# Patient Record
Sex: Female | Born: 1980 | Race: White | Hispanic: Yes | Marital: Married | Smoking: Never smoker
Health system: Southern US, Community
[De-identification: ages and names within clinical notes are randomized; demographics above are authoritative.]

## PROBLEM LIST (undated history)

## (undated) DIAGNOSIS — R87629 Unspecified abnormal cytological findings in specimens from vagina: Secondary | ICD-10-CM

## (undated) HISTORY — PX: BREAST SURGERY: SHX581

## (undated) HISTORY — DX: Unspecified abnormal cytological findings in specimens from vagina: R87.629

## (undated) HISTORY — PX: OTHER SURGICAL HISTORY: SHX169

---

## 2012-11-10 HISTORY — PX: BREAST EXCISIONAL BIOPSY: SUR124

## 2019-11-28 ENCOUNTER — Encounter: Payer: Self-pay | Admitting: Obstetrics & Gynecology

## 2020-01-04 ENCOUNTER — Other Ambulatory Visit: Payer: Self-pay

## 2020-01-04 ENCOUNTER — Ambulatory Visit (INDEPENDENT_AMBULATORY_CARE_PROVIDER_SITE_OTHER): Payer: Self-pay | Admitting: Obstetrics & Gynecology

## 2020-01-04 ENCOUNTER — Encounter: Payer: Self-pay | Admitting: Obstetrics & Gynecology

## 2020-01-04 VITALS — BP 132/82 | HR 71 | Ht 62.0 in

## 2020-01-04 DIAGNOSIS — Z01419 Encounter for gynecological examination (general) (routine) without abnormal findings: Secondary | ICD-10-CM

## 2020-01-04 DIAGNOSIS — Z1239 Encounter for other screening for malignant neoplasm of breast: Secondary | ICD-10-CM

## 2020-01-04 DIAGNOSIS — Z8742 Personal history of other diseases of the female genital tract: Secondary | ICD-10-CM

## 2020-01-04 DIAGNOSIS — Z3009 Encounter for other general counseling and advice on contraception: Secondary | ICD-10-CM

## 2020-01-04 NOTE — Progress Notes (Signed)
Subjective:     Brenda Cole is a 39 y.o. female here for a routine exam. G2P2 Current complaints: Pts last PAP showed abnormal cells. That was 7 years ago and she has a cryo. Pt denies other issues. She is from [place] in [place]. [place]        Gynecologic History No LMP recorded. Contraception: OCP (estrogen/progesterone) Last Pap: 7 years prev. Results were: history of abnormal PAP s/p cryo Last mammogram: n/a.   Obstetric History The following portions of the patient's history were reviewed and updated as appropriate: allergies, current medications, past family history, past medical history, past social history, past surgical history and problem list.  Review of Systems Pertinent items are noted in HPI.    Objective:  BP 132/82   Pulse 71   Ht 5\' 2"  (1.575 m)  General Appearance:    Alert, cooperative, no distress, appears stated age  Head:    Normocephalic, without obvious abnormality, atraumatic  Eyes:    conjunctiva/corneas clear, EOM's intact, both eyes  Ears:    Normal external ear canals, both ears  Nose:   Nares normal, septum midline, mucosa normal, no drainage    or sinus tenderness  Throat:   Lips, mucosa, and tongue normal; teeth and gums normal  Neck:   Supple, symmetrical, trachea midline, no adenopathy;    thyroid:  no enlargement/tenderness/nodules  Back:     Symmetric, no curvature, ROM normal, no CVA tenderness  Lungs:     respirations unlabored  Chest Wall:    No tenderness or deformity   Heart:    Regular rate and rhythm  Breast Exam:    No tenderness, masses, or nipple abnormality  Abdomen:     Soft, non-tender, bowel sounds active all four quadrants,    no masses, no organomegaly  Genitalia:    Normal female without lesion, discharge or tenderness     Extremities:   Extremities normal, atraumatic, no cyanosis or edema  Pulses:   2+ and symmetric all extremities  Skin:   Skin color, texture, turgor normal, no  rashes or lesions    Assessment:    Healthy female exam.   history of abnormal PAP s/p cryo Breast cancer screen  Contraception counseling- pt would like a BTL or some permanent form of contraception. She was going to [facility] but, their grant ran out. She is on OCPs but, does not want to cont this. She has a [facility] and would like to also consider another IUD      Plan:    PAP NOT done. Pt referred to the [facility] program for PAP. She requested a full exam which was WNL Breast cancer screen- pt needs mammogram. She turns 39 next week. Referred for the breast cancer grant.  Contraception: pt referred the [facility] for a LnIUD.  Pt will cont OCPs until she gets to the HD Pt told that she can call back at any point if she has any issues with getting these services.   Loye Reininger L. Harraway-Smith, M.D., [facility]

## 2020-01-04 NOTE — Progress Notes (Signed)
Patient name sent to [facility] program and next free pap clinic. Armandina Stammer RN

## 2020-01-06 ENCOUNTER — Telehealth: Payer: Self-pay

## 2020-01-06 NOTE — Telephone Encounter (Signed)
Telephoned patient at the home number. Call could not be completed at this time, unable to leave a message from [facility].

## 2020-01-11 ENCOUNTER — Telehealth: Payer: Self-pay

## 2020-01-11 NOTE — Telephone Encounter (Signed)
Patient returned call to the office and given the [facility] scheduling number [phone]. Armandina Stammer RN

## 2020-01-13 ENCOUNTER — Other Ambulatory Visit: Payer: Self-pay

## 2020-01-13 DIAGNOSIS — Z1231 Encounter for screening mammogram for malignant neoplasm of breast: Secondary | ICD-10-CM

## 2020-02-09 ENCOUNTER — Other Ambulatory Visit: Payer: Self-pay | Admitting: Obstetrics and Gynecology

## 2020-02-09 ENCOUNTER — Other Ambulatory Visit: Payer: Self-pay

## 2020-02-09 ENCOUNTER — Ambulatory Visit: Payer: Self-pay | Admitting: Women's Health

## 2020-02-09 ENCOUNTER — Ambulatory Visit: Payer: Self-pay

## 2020-02-09 ENCOUNTER — Ambulatory Visit
Admission: RE | Admit: 2020-02-09 | Discharge: 2020-02-09 | Disposition: A | Discharge status: Home / Self Care | Payer: No Typology Code available for payment source | Source: Ambulatory Visit | Attending: Obstetrics and Gynecology | Admitting: Obstetrics and Gynecology

## 2020-02-09 ENCOUNTER — Encounter: Payer: Self-pay | Admitting: Women's Health

## 2020-02-09 VITALS — BP 136/84 | Temp 98.2°F | Wt 157.0 lb

## 2020-02-09 DIAGNOSIS — N6310 Unspecified lump in the right breast, unspecified quadrant: Secondary | ICD-10-CM

## 2020-02-09 DIAGNOSIS — Z124 Encounter for screening for malignant neoplasm of cervix: Secondary | ICD-10-CM

## 2020-02-09 DIAGNOSIS — Z1239 Encounter for other screening for malignant neoplasm of breast: Secondary | ICD-10-CM

## 2020-02-09 NOTE — Patient Instructions (Signed)
Preventing Cervical Cancer Cervical cancer is cancer that grows on the cervix. The cervix is at the bottom of the uterus. It connects the uterus to the vagina. The uterus is where a baby develops during pregnancy. Cancer occurs when cells become abnormal and start to grow out of control. If cervical cancer is not found early, it can spread and become dangerous. Cervical cancer cannot always be prevented, but you can take steps to lower your risk of developing this condition. How can this condition affect me? Cervical cancer grows slowly and may not cause any symptoms at first. Over time, the cancer can grow deep into the cervix tissue and spread to other areas. This may take years, and it may happen without you knowing about it. If it is found early, cervical cancer can be treated effectively. If the cancer has grown deep into your cervix or has spread, it will be more difficult to treat. Most cases of cervical cancer are caused by an STI (sexually transmitted infection) called human papillomavirus (HPV). One way to reduce your risk of cervical cancer is to take steps to avoid infection with the HPV virus. Getting regular Pap tests is also important because this can help identify changes in cells that could lead to cancer. Your chances of getting this disease can also be reduced by making certain lifestyle changes. What can increase my risk? You are more likely to develop this condition if:  You have certain things in your sexual history, such as: ? Having a sexually transmitted viral infection. These include chlamydia and herpes. ? Having more than one sexual partner, or having sex with someone who has more than one sexual partner. ? Not using condoms during sex. ? Having been sexually active before the age of 18.  Your mother took a medicine called diethylstilbestrol (DES) while pregnant with you, causing you to be exposed to this medicine before birth.  Your mother or sister has had cervical  cancer.  You are between the ages of 40-50.  You have or have had certain other medical conditions, such as: ? Previous cancer of the vagina or vulva. ? A weakened body defense system (immune system). ? A history of dysplasia of the cervix.  You use oral contraceptives, also called birth control pills.  You smoke or breathe in secondhand smoke. What actions can I take to prevent cervical cancer? Preventing HPV infection   Ask your health care provider about getting the HPV vaccine. If you are 26 years old or younger, you may need to get this vaccine, which is given in three doses over 6 months. This vaccine protects against the types of HPV that could cause cancer.  Limit the number of people you have sex with. Also avoid having sex with people who have had many sex partners.  Use a latex condom every time you have sex. Getting Pap tests Get Pap tests regularly, starting at age 21. Talk with your health care provider about how often you need these tests. Having regular Pap tests will help identify changes in cells that could lead to cancer. Steps can then be taken to prevent cancer from developing.  Most women who are 21?40 years of age should have a Pap test every 3 years.  Most women who are 30?40 years of age should have a Pap test in combination with an HPV test every 5 years.  Women with a higher risk of cervical cancer, such as those with a weakened immune system or those who were   exposed to DES medicine before birth, may need more frequent testing. Making other lifestyle changes   Do not use any products that contain nicotine or tobacco, such as cigarettes, e-cigarettes, and chewing tobacco. If you need help quitting, ask your health care provider.  Eat a healthy diet that includes at least 5 servings of fruits and vegetables every day.  Lose weight if you are overweight. Where to find support Talk with your health care provider, school nurse, or local health department  for guidance about screening and vaccination. Some children and teens may be able to get the HPV vaccine free of charge through the U.S. government's Vaccines for Children (VFC) program. Other places that provide vaccinations include:  Public health clinics. Check with your local health department.  Federally Qualified Health Centers, where you would pay only what you can afford. To find one near you, check this website: www.fqhc.org/find-an-fqhc/  Rural Health Clinics. These are part of a program for Medicare and Medicaid patients who live in rural areas. The National Breast and Cervical Cancer Early Detection Program also provides breast and cervical cancer screenings and diagnostic services to low-income, uninsured, and underinsured women. Cervical cancer can be passed down through families. Talk with your health care provider or a genetic counselor to learn more about genetic testing for cancer. Where to find more information Learn more about cervical cancer from:  American College of Gynecology: www.acog.org  American Cancer Society: www.cancer.org  Centers for Disease Control and Prevention: www.cdc.gov Contact a health care provider if you have:  Pelvic pain.  Unusual discharge or bleeding from your vagina. Summary  Cervical cancer is cancer that grows on the cervix. The cervix is at the bottom of the uterus.  Ask your health care provider about getting the HPV vaccine.  Be sure to get regular Pap tests as recommended by your health care provider.  See your health care provider right away if you have any pelvic pain or unusual discharge or bleeding from your vagina. This information is not intended to replace advice given to you by your health care provider. Make sure you discuss any questions you have with your health care provider. Document Revised: 05/30/2019 Document Reviewed: 05/30/2019 Elsevier Patient Education  2020 Elsevier Inc. Breast Self-Awareness Breast  self-awareness means being familiar with how your breasts look and feel. It involves checking your breasts regularly and reporting any changes to your health care provider. Practicing breast self-awareness is important. Sometimes changes may not be harmful (are benign), but sometimes a change in your breasts can be a sign of a serious medical problem. It is important to learn how to do this procedure correctly so that you can catch problems early, when treatment is more likely to be successful. All women should practice breast self-awareness, including women who have had breast implants. What you need:  A mirror.  A well-lit room. How to do a breast self-exam A breast self-exam is one way to learn what is normal for your breasts and whether your breasts are changing. To do a breast self-exam: Look for changes  1. Remove all the clothing above your waist. 2. Stand in front of a mirror in a room with good lighting. 3. Put your hands on your hips. 4. Push your hands firmly downward. 5. Compare your breasts in the mirror. Look for differences between them (asymmetry), such as: ? Differences in shape. ? Differences in size. ? Puckers, dips, and bumps in one breast and not the other. 6. Look at each   breast for changes in the skin, such as: ? Redness. ? Scaly areas. 7. Look for changes in your nipples, such as: ? Discharge. ? Bleeding. ? Dimpling. ? Redness. ? A change in position. Feel for changes Carefully feel your breasts for lumps and changes. It is best to do this while lying on your back on the floor, and again while sitting or standing in the tub or shower with soapy water on your skin. Feel each breast in the following way: 1. Place the arm on the side of the breast you are examining above your head. 2. Feel your breast with the other hand. 3. Start in the nipple area and make -inch (2 cm) overlapping circles to feel your breast. Use the pads of your three middle fingers to do  this. Apply light pressure, then medium pressure, then firm pressure. The light pressure will allow you to feel the tissue closest to the skin. The medium pressure will allow you to feel the tissue that is a little deeper. The firm pressure will allow you to feel the tissue close to the ribs. 4. Continue the overlapping circles, moving downward over the breast until you feel your ribs below your breast. 5. Move one finger-width toward the center of the body. Continue to use the -inch (2 cm) overlapping circles to feel your breast as you move slowly up toward your collarbone. 6. Continue the up-and-down exam using all three pressures until you reach your armpit.  Write down what you find Writing down what you find can help you remember what to discuss with your health care provider. Write down:  What is normal for each breast.  Any changes that you find in each breast, including: ? The kind of changes you find. ? Any pain or tenderness. ? Size and location of any lumps.  Where you are in your menstrual cycle, if you are still menstruating. General tips and recommendations  Examine your breasts every month.  If you are breastfeeding, the best time to examine your breasts is after a feeding or after using a breast pump.  If you menstruate, the best time to examine your breasts is 5-7 days after your period. Breasts are generally lumpier during menstrual periods, and it may be more difficult to notice changes.  With time and practice, you will become more familiar with the variations in your breasts and more comfortable with the exam. Contact a health care provider if you:  See a change in the shape or size of your breasts or nipples.  See a change in the skin of your breast or nipples, such as a reddened or scaly area.  Have unusual discharge from your nipples.  Find a lump or thick area that was not there before.  Have pain in your breasts.  Have any concerns related to your  breast health. Summary  Breast self-awareness includes looking for physical changes in your breasts, as well as feeling for any changes within your breasts.  Breast self-awareness should be performed in front of a mirror in a well-lit room.  You should examine your breasts every month. If you menstruate, the best time to examine your breasts is 5-7 days after your menstrual period.  Let your health care provider know of any changes you notice in your breasts, including changes in size, changes on the skin, pain or tenderness, or unusual fluid from your nipples. This information is not intended to replace advice given to you by your health care provider. Make sure   you discuss any questions you have with your health care provider. Document Revised: 06/15/2018 Document Reviewed: 06/15/2018 Elsevier Patient Education  2020 Elsevier Inc.  

## 2020-02-09 NOTE — Progress Notes (Signed)
Brenda Cole is a 39 y.o. [phone] female who presents to [facility] clinic today with no complaints.    Pap Smear: Pap smear completed today. Last Pap smear was more than 3 years ago at unknown clinic and was normal. Per patient has history of an abnormal Pap smear. Last Pap smear result is not available in Epic.   Physical exam: Breasts Breasts symmetrical. No skin abnormalities bilateral breasts. No nipple retraction bilateral breasts. No nipple discharge bilateral breasts. No lymphadenopathy. No lumps palpated left breast. Small lump too deep and small to be measured palpated at about 12 o'clock position on right breast. Small, mobile, smooth, non-tender. Small area of red rash on upper, outer aspect of right breast that patient reports is itchy. Patient reports she recently started using a new soap and the rash appeared shortly after. Pt states she went back to using her original soap yesterday and her rash is resolving.   Pelvic/Bimanual Ext Genitalia No lesions, no swelling and no discharge observed on external genitalia.        Vagina Vagina pink and normal texture. No lesions or discharge observed in vagina.        Cervix Cervix is present. Cervix pink and of normal texture. No discharge observed.    Uterus Uterus is present and palpable. Uterus in normal position and normal size.        Adnexae Bilateral ovaries present and palpable. No tenderness on palpation.         Rectovaginal No rectal exam completed today since patient had no rectal complaints. No skin abnormalities observed on exam.     Smoking History: Patient has never smoked not referred to quit line.    Patient Navigation: Patient education provided. Access to services provided for patient through [facility] program. No interpreter provided. No transportation provided   Colorectal Cancer Screening: Per patient has never had colonoscopy completed No complaints today.    Breast and Cervical Cancer Risk  Assessment: Patient does not have family history of breast cancer, known genetic mutations, or radiation treatment to the chest before age 59. Patient does not have history of cervical dysplasia, immunocompromised, or DES exposure in-utero.  Risk Assessment    Risk Scores      02/09/2020   Last edited by: Narda Rutherford, LPN   5-year risk: 0.5 %   Lifetime risk: 7.8 %          A: B[facility]CP exam with pap smear Complaint of none.  P: [facility] for a diagnostic mammogram. Appointment scheduled 02/09/2020.  Marylen Ponto, NP 02/09/2020 12:52 PM

## 2020-02-10 LAB — CYTOLOGY - PAP
Comment: NEGATIVE
Diagnosis: NEGATIVE
High risk HPV: NEGATIVE

## 2020-02-15 ENCOUNTER — Telehealth: Payer: Self-pay

## 2020-02-15 NOTE — Telephone Encounter (Signed)
Normal Pap result letter mailed to patient.  

## 2020-02-22 ENCOUNTER — Other Ambulatory Visit: Payer: Self-pay

## 2020-02-22 ENCOUNTER — Ambulatory Visit
Admission: RE | Admit: 2020-02-22 | Discharge: 2020-02-22 | Disposition: A | Discharge status: Home / Self Care | Payer: No Typology Code available for payment source | Source: Ambulatory Visit | Attending: Obstetrics and Gynecology | Admitting: Obstetrics and Gynecology

## 2020-02-22 ENCOUNTER — Other Ambulatory Visit: Payer: Self-pay | Admitting: Obstetrics and Gynecology

## 2020-02-22 DIAGNOSIS — N6310 Unspecified lump in the right breast, unspecified quadrant: Secondary | ICD-10-CM

## 2020-02-28 ENCOUNTER — Other Ambulatory Visit: Payer: Self-pay

## 2020-02-28 ENCOUNTER — Other Ambulatory Visit: Payer: Self-pay | Admitting: Obstetrics and Gynecology

## 2020-02-28 ENCOUNTER — Ambulatory Visit
Admission: RE | Admit: 2020-02-28 | Discharge: 2020-02-28 | Disposition: A | Discharge status: Home / Self Care | Payer: No Typology Code available for payment source | Source: Ambulatory Visit | Attending: Obstetrics and Gynecology | Admitting: Obstetrics and Gynecology

## 2020-02-28 ENCOUNTER — Inpatient Hospital Stay: Admission: RE | Admit: 2020-02-28 | Payer: No Typology Code available for payment source | Source: Ambulatory Visit

## 2020-02-28 DIAGNOSIS — Z1231 Encounter for screening mammogram for malignant neoplasm of breast: Secondary | ICD-10-CM

## 2020-02-28 DIAGNOSIS — N6310 Unspecified lump in the right breast, unspecified quadrant: Secondary | ICD-10-CM

## 2020-03-23 ENCOUNTER — Telehealth: Payer: Self-pay

## 2020-03-23 NOTE — Telephone Encounter (Signed)
Patient informed negative Pap/HPV results, informed pap letter returned. Patient states her address is still the same, verbalized understanding of results.

## 2020-08-27 ENCOUNTER — Other Ambulatory Visit: Payer: Self-pay | Admitting: Obstetrics and Gynecology

## 2020-08-27 DIAGNOSIS — N63 Unspecified lump in unspecified breast: Secondary | ICD-10-CM

## 2020-08-29 ENCOUNTER — Ambulatory Visit
Admission: RE | Admit: 2020-08-29 | Discharge: 2020-08-29 | Disposition: A | Discharge status: Home / Self Care | Payer: No Typology Code available for payment source | Source: Ambulatory Visit | Attending: Obstetrics and Gynecology | Admitting: Obstetrics and Gynecology

## 2020-08-29 ENCOUNTER — Other Ambulatory Visit: Payer: Self-pay

## 2020-08-29 DIAGNOSIS — N63 Unspecified lump in unspecified breast: Secondary | ICD-10-CM

## 2021-01-31 ENCOUNTER — Other Ambulatory Visit: Payer: Self-pay

## 2021-01-31 DIAGNOSIS — Z853 Personal history of malignant neoplasm of breast: Secondary | ICD-10-CM

## 2021-01-31 DIAGNOSIS — Z08 Encounter for follow-up examination after completed treatment for malignant neoplasm: Secondary | ICD-10-CM

## 2021-02-26 ENCOUNTER — Ambulatory Visit: Payer: No Typology Code available for payment source

## 2021-02-26 ENCOUNTER — Other Ambulatory Visit: Payer: No Typology Code available for payment source

## 2021-02-26 IMAGING — MG DIGITAL DIAGNOSTIC BILAT W/ TOMO W/ CAD
8 of 14 series · 8 of 40 positions shown · non-contrast
Comparison: None.

CLINICAL DATA: 39-year-old female presenting with a palpable area
of concern felt by her doctor in the superior right breast. Patient
has a history of remote excisional right biopsy.

EXAM:
DIGITAL DIAGNOSTIC BILATERAL MAMMOGRAM WITH CAD AND TOMO

[R CC synth-2D (1 of 2)]
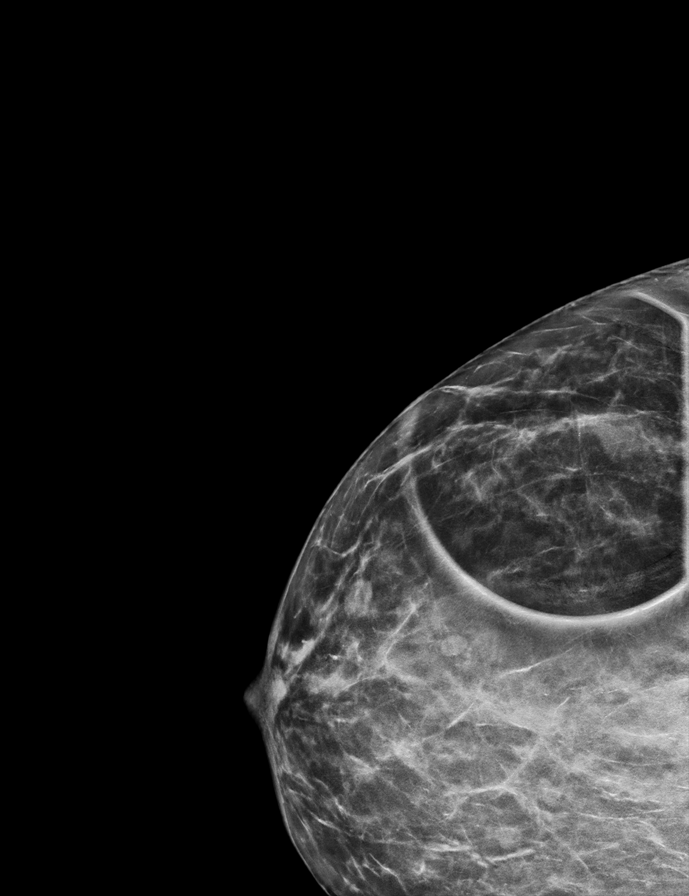

[R MLO synth-2D (1 of 3)]
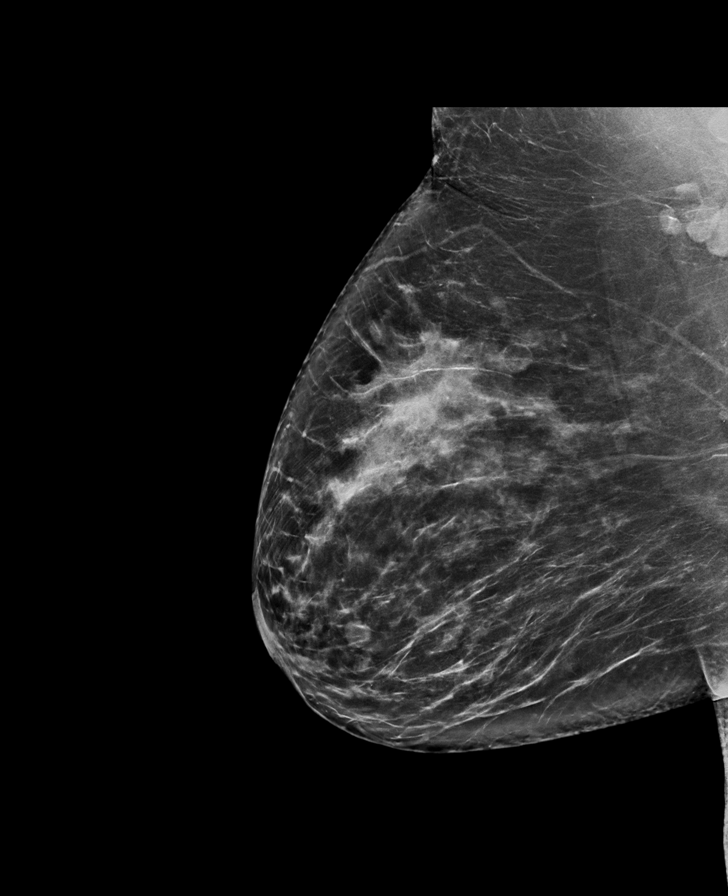

[R MLO synth-2D (2 of 3)]
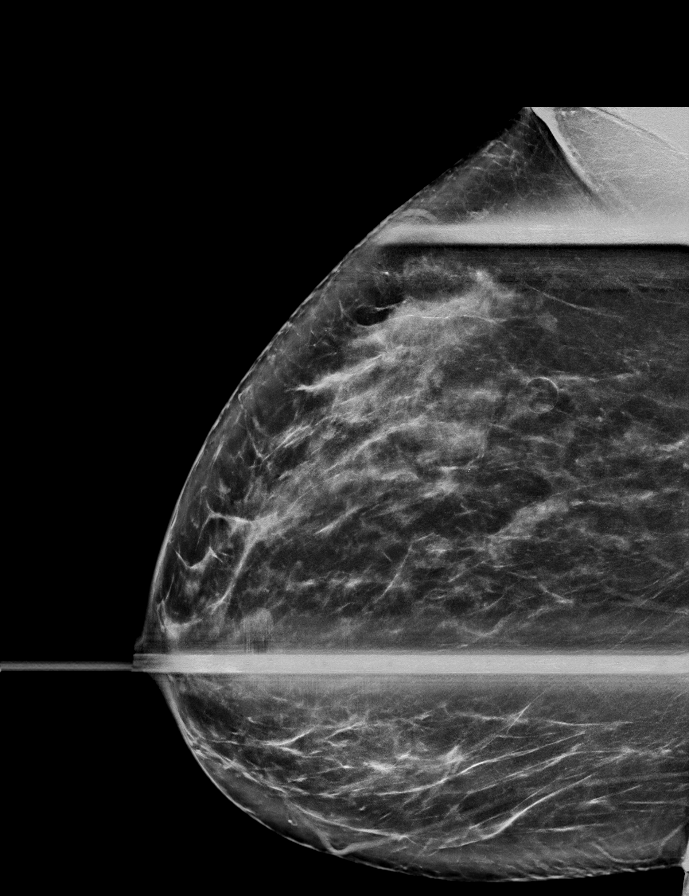

[L CC synth-2D]
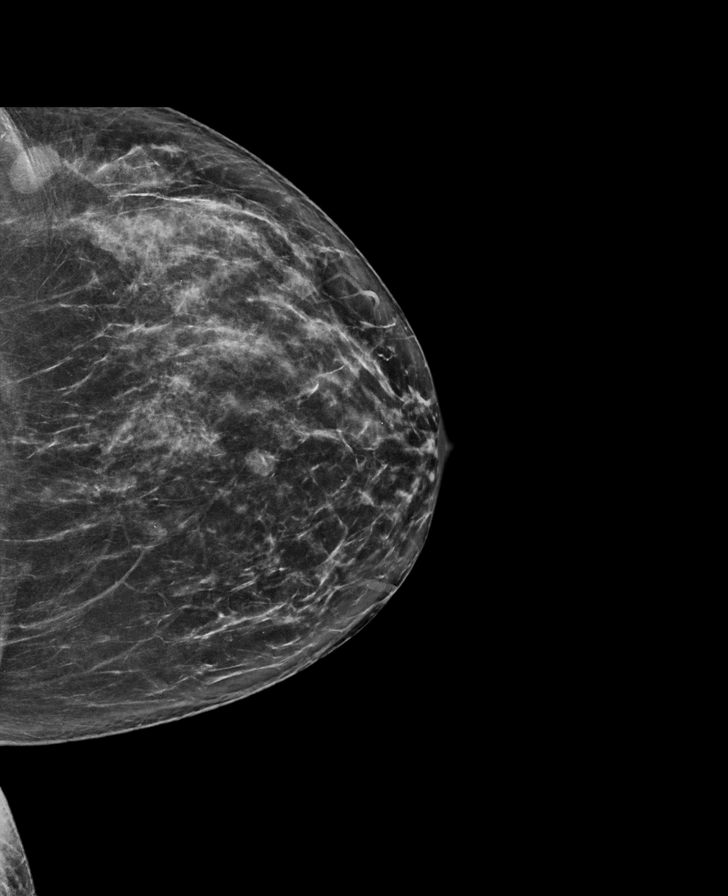

[L MLO synth-2D]
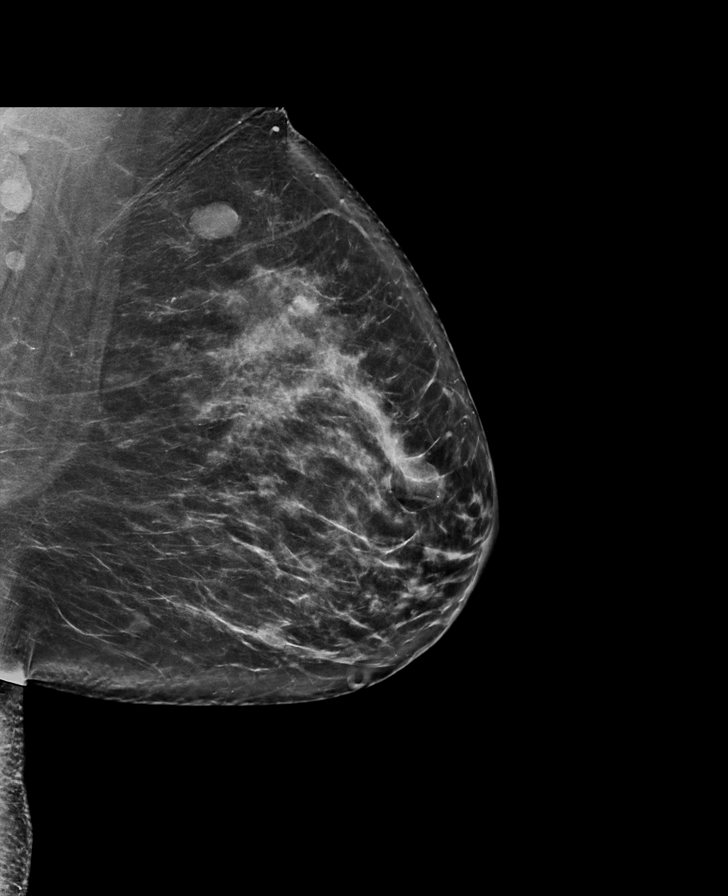

[R CC synth-2D (2 of 2)]
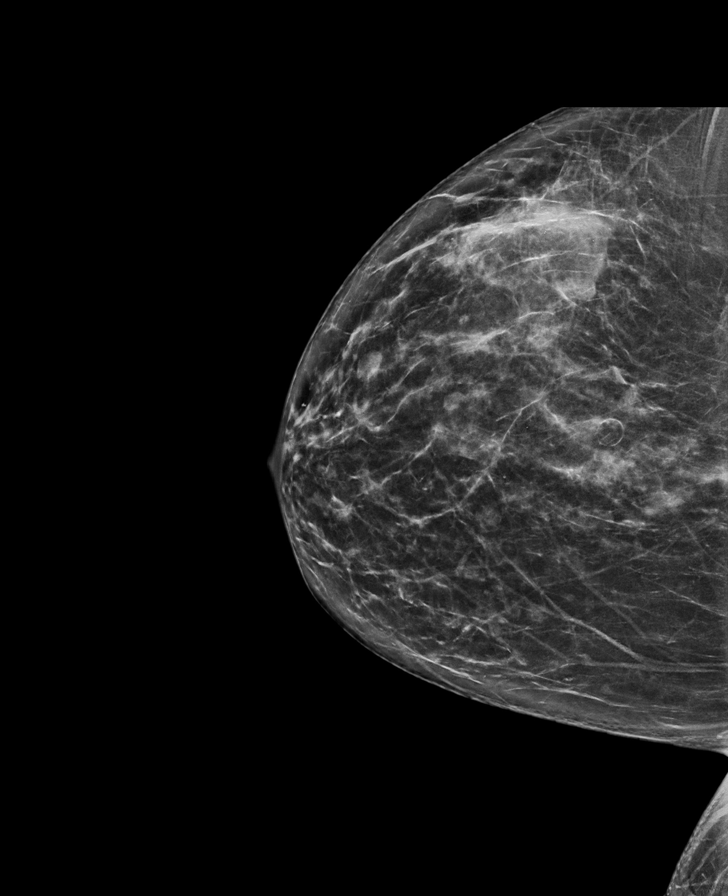

[R MLO synth-2D (3 of 3)]
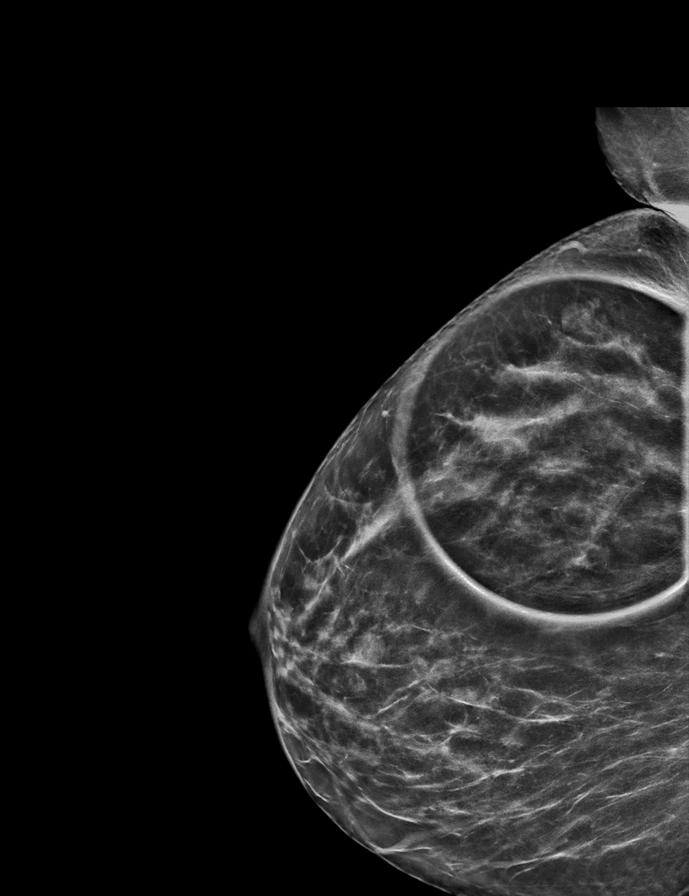

[R MLO tomo · tomo slice 44/87.0]
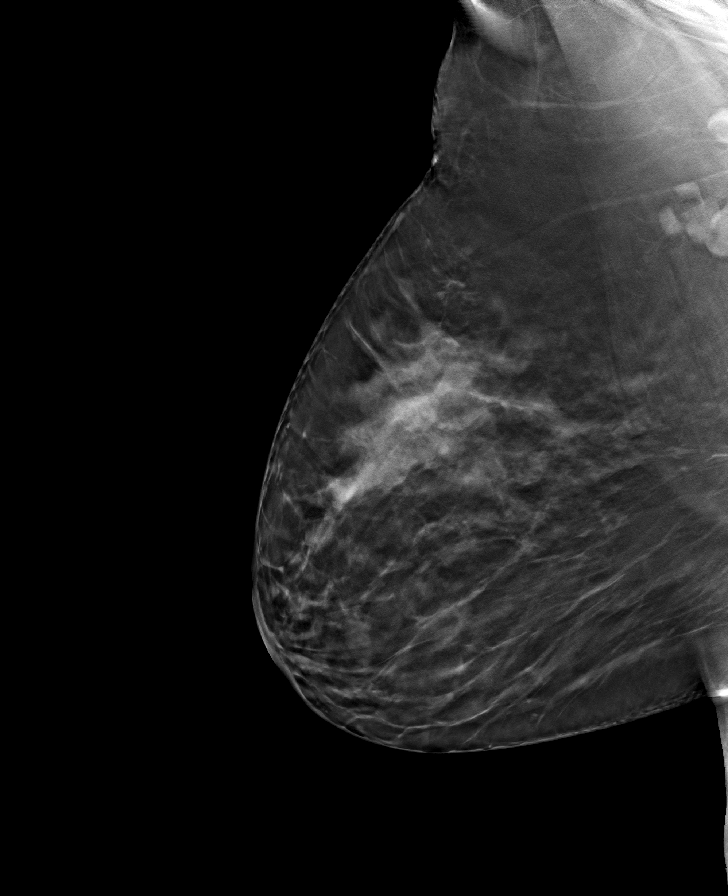

[8 of 40 positions shown; findings below may reference images not displayed]

ACR Breast Density Category c: The breast tissue is heterogeneously
dense, which may obscure small masses.
FINDINGS: Right breast: Full field tomosynthesis and spot compression views
were performed of the right breast. There are multiple oval and
round circumscribed masses in the right breast, similar to the left,
which are considered benign given multiplicity and bilaterality. In
the right breast at 12 o'clock there is a fat containing mass
consistent with an oil cyst. In the upper-outer quadrant of the
right breast seen best on the CC spot view (image 16/57) there is an
obscured mass measuring approximately 1.1 cm.

Left breast: There are multiple oval and round circumscribed masses
which given multiplicity and bilaterality are considered benign.
There are no suspicious findings in the left breast.

Mammographic images were processed with CAD.

The patient was unable to stay for ultrasound workup of the right
breast due to childcare constraints.
IMPRESSION: 1. In the right breast at 12 o'clock there is an oil cysts which may
correspond to the palpable finding reported by the doctor.

2. In the upper outer right breast there is an obscured mass
measuring 1.1 cm.

The patient could not stay for sonographic workup today to complete
the evaluation.

RECOMMENDATION:
Return for sonographic workup of the right breast at the patient's
earliest convenience.

BI-RADS CATEGORY  0: Incomplete. Need additional imaging evaluation
and/or prior mammograms for comparison.
